# Patient Record
Sex: Female | Born: 1970 | Hispanic: Yes | Marital: Married | Smoking: Never smoker
Health system: Southern US, Community
[De-identification: ages and names within clinical notes are randomized; demographics above are authoritative.]

## PROBLEM LIST (undated history)

## (undated) DIAGNOSIS — E119 Type 2 diabetes mellitus without complications: Secondary | ICD-10-CM

## (undated) HISTORY — PX: CHOLECYSTECTOMY: SHX55

---

## 2015-06-12 ENCOUNTER — Ambulatory Visit: Payer: Self-pay | Attending: Oncology

## 2018-12-06 ENCOUNTER — Other Ambulatory Visit: Payer: Self-pay

## 2018-12-06 ENCOUNTER — Emergency Department: Payer: Self-pay

## 2018-12-06 ENCOUNTER — Emergency Department
Admission: EM | Admit: 2018-12-06 | Discharge: 2018-12-06 | Disposition: A | Discharge status: Home / Self Care | Payer: Self-pay | Attending: Emergency Medicine | Admitting: Emergency Medicine

## 2018-12-06 ENCOUNTER — Encounter: Payer: Self-pay | Admitting: Emergency Medicine

## 2018-12-06 DIAGNOSIS — R1013 Epigastric pain: Secondary | ICD-10-CM | POA: Insufficient documentation

## 2018-12-06 DIAGNOSIS — R1010 Upper abdominal pain, unspecified: Secondary | ICD-10-CM

## 2018-12-06 DIAGNOSIS — E119 Type 2 diabetes mellitus without complications: Secondary | ICD-10-CM | POA: Insufficient documentation

## 2018-12-06 HISTORY — DX: Type 2 diabetes mellitus without complications: E11.9

## 2018-12-06 LAB — CBC
HCT: 40 % (ref 36.0–46.0)
Hemoglobin: 13.5 g/dL (ref 12.0–15.0)
MCH: 30.1 pg (ref 26.0–34.0)
MCHC: 33.8 g/dL (ref 30.0–36.0)
MCV: 89.1 fL (ref 80.0–100.0)
NRBC: 0 % (ref 0.0–0.2)
Platelets: 276 10*3/uL (ref 150–400)
RBC: 4.49 MIL/uL (ref 3.87–5.11)
RDW: 12.6 % (ref 11.5–15.5)
WBC: 6.6 10*3/uL (ref 4.0–10.5)

## 2018-12-06 LAB — COMPREHENSIVE METABOLIC PANEL
ALT: 17 U/L (ref 0–44)
AST: 16 U/L (ref 15–41)
Albumin: 4.1 g/dL (ref 3.5–5.0)
Alkaline Phosphatase: 65 U/L (ref 38–126)
Anion gap: 5 (ref 5–15)
BUN: 11 mg/dL (ref 6–20)
CALCIUM: 9 mg/dL (ref 8.9–10.3)
CO2: 25 mmol/L (ref 22–32)
Chloride: 107 mmol/L (ref 98–111)
Creatinine, Ser: 0.55 mg/dL (ref 0.44–1.00)
GFR calc non Af Amer: >60 mL/min (ref >60)
Glucose, Bld: 91 mg/dL (ref 70–99)
Potassium: 3.5 mmol/L (ref 3.5–5.1)
Sodium: 137 mmol/L (ref 135–145)
Total Bilirubin: 1.1 mg/dL (ref 0.3–1.2)
Total Protein: 8 g/dL (ref 6.5–8.1)

## 2018-12-06 LAB — URINALYSIS, COMPLETE (UACMP) WITH MICROSCOPIC
BILIRUBIN URINE: NEGATIVE
Bacteria, UA: NONE SEEN
Glucose, UA: NEGATIVE mg/dL
Ketones, ur: NEGATIVE mg/dL
Nitrite: NEGATIVE
Protein, ur: NEGATIVE mg/dL
Specific Gravity, Urine: 1.018 (ref 1.005–1.030)
pH: 5 (ref 5.0–8.0)

## 2018-12-06 LAB — LIPASE, BLOOD: Lipase: 31 U/L (ref 11–51)

## 2018-12-06 MED ORDER — ALUM & MAG HYDROXIDE-SIMETH 200-200-20 MG/5ML PO SUSP
30.0000 mL | Freq: Once | ORAL | Status: AC
  Administered 2018-12-06: 30 mL via ORAL
  Filled 2018-12-06: qty 30

## 2018-12-06 MED ORDER — IOHEXOL 300 MG/ML  SOLN
100.0000 mL | Freq: Once | INTRAMUSCULAR | Status: AC | PRN
  Administered 2018-12-06: 100 mL via INTRAVENOUS
  Filled 2018-12-06: qty 100

## 2018-12-06 MED ORDER — IOPAMIDOL (ISOVUE-300) INJECTION 61%
100.0000 mL | Freq: Once | INTRAVENOUS | Status: DC | PRN
  Filled 2018-12-06: qty 100

## 2018-12-06 MED ORDER — LIDOCAINE VISCOUS HCL 2 % MT SOLN
15.0000 mL | Freq: Once | OROMUCOSAL | Status: AC
  Administered 2018-12-06: 15 mL via ORAL
  Filled 2018-12-06: qty 15

## 2018-12-06 MED ORDER — SODIUM CHLORIDE 0.9% FLUSH: 3.0000 mL | Freq: Once | INTRAVENOUS | Status: DC

## 2018-12-06 MED ORDER — IOPAMIDOL (ISOVUE-300) INJECTION 61%
30.0000 mL | Freq: Once | INTRAVENOUS | Status: DC | PRN
  Filled 2018-12-06: qty 30

## 2018-12-06 MED ORDER — TRAMADOL HCL 50 MG PO TABS: 50.0000 mg | ORAL_TABLET | Freq: Four times a day (QID) | ORAL | 0 refills | Status: DC | PRN

## 2018-12-06 NOTE — ED Triage Notes (Signed)
Pt sent from [facility] center with c/o abd pain described as throbing since last night, denies n/v/d. Interpreter used.

## 2018-12-06 NOTE — Discharge Instructions (Addendum)
Please follow up with general surgery. You have inflammation of your gallbladder that has been there for a long time. This needs to be evaluated by a surgeon.

## 2018-12-06 NOTE — ED Notes (Signed)
Interpreter requested for discharge 

## 2018-12-06 NOTE — ED Provider Notes (Signed)
[facility] Emergency Department Provider Note   ____________________________________________    I have reviewed the triage vital signs and the nursing notes.   HISTORY  Chief Complaint Abdominal Pain  Spanish interpreter Kandis Cocking used   HPI Maria Schmitt is a 48 y.o. female who presents with complaints of abdominal pain.  Patient reports 2 days of primarily epigastric abdominal pain which she notes is constant and moderate and burning in intensity.  She denies a history of abdominal surgery.  Yesterday she took 2 Tylenol for this which did help somewhat.  No significant nausea or vomiting.  No fevers or chills.  No recent travel.  Past Medical History:  Diagnosis Date  . Diabetes mellitus without complication (HCC)     There are no active problems to display for this patient.   History reviewed. No pertinent surgical history.  Prior to Admission medications   Medication Sig Start Date End Date Taking? Authorizing Provider  traMADol (ULTRAM) 50 MG tablet Take 1 tablet (50 mg total) by mouth every 6 (six) hours as needed. 12/06/18 12/06/19  Jene Every, MD     Allergies Patient has no known allergies.  No family history on file.  Social History Social History   Tobacco Use  . Smoking status: Never Smoker  . Smokeless tobacco: Never Used  Substance Use Topics  . Alcohol use: Not on file  . Drug use: Not on file    Review of Systems  Constitutional: No fever/chills Eyes: No visual changes.  ENT: No sore throat. Cardiovascular: Denies chest pain. Respiratory: Denies shortness of breath. Gastrointestinal: As above Genitourinary: Negative for dysuria. Musculoskeletal: Negative for back pain. Skin: Negative for rash. Neurological: Negative for headaches    ____________________________________________   PHYSICAL EXAM:  VITAL SIGNS: ED Triage Vitals  Enc Vitals Group     BP 12/06/18 1323 100/77     Pulse Rate 12/06/18  1323 71     Resp 12/06/18 1323 16     Temp 12/06/18 1323 98.1 F (36.7 C)     Temp Source 12/06/18 1323 Oral     SpO2 12/06/18 1323 96 %     Weight --      Height --      Head Circumference --      Peak Flow --      Pain Score 12/06/18 1324 6     Pain Loc --      Pain Edu? --      Excl. in GC? --     Constitutional: Alert and oriented. No acute distress.  Eyes: Conjunctivae are normal.   Nose: No congestion/rhinnorhea. Mouth/Throat: Mucous membranes are moist.    Cardiovascular: Normal rate, regular rhythm. Grossly normal heart sounds.  Good peripheral circulation. Respiratory: Normal respiratory effort.  No retractions. Lungs CTAB. Gastrointestinal: Mild epigastric tenderness, no significant distention, no CVA tenderness  Musculoskeletal: Warm and well perfused Neurologic:  Normal speech and language. No gross focal neurologic deficits are appreciated.  Skin:  Skin is warm, dry and intact. No rash noted.  ____________________________________________   LABS (all labs ordered are listed, but only abnormal results are displayed)  Labs Reviewed  URINALYSIS, COMPLETE (UACMP) WITH MICROSCOPIC - Abnormal; Notable for the following components:      Result Value   Color, Urine YELLOW (*)    APPearance CLEAR (*)    Hgb urine dipstick MODERATE (*)    Leukocytes,Ua SMALL (*)    All other components within normal limits  LIPASE, BLOOD  COMPREHENSIVE METABOLIC PANEL  CBC  POC URINE PREG, ED   ____________________________________________  EKG  None ____________________________________________  RADIOLOGY  None____________________________________________   PROCEDURES  Procedure(s) performed: No  Procedures   Critical Care performed: No ____________________________________________   INITIAL IMPRESSION / ASSESSMENT AND PLAN / ED COURSE  Pertinent labs & imaging results that were available during my care of the patient were reviewed by me and considered in my  medical decision making (see chart for details).  Patient presents with epigastric discomfort primarily, suspicious for gastritis, lab work is overall quite reassuring, lipase LFTs normal.  We will treat with GI cocktail and reevaluate  GI cocktail with mild improvement.  Ultrasound obtained which showed chronic changes however the patient has no significant tenderness over the right upper quadrant, discussed with her at length the need for outpatient follow-up with general surgery for further evaluation especially given polyps noted and the fact that they could be cancerous.  Obtain CT abdomen pelvis which was unremarkable.  Unclear cause of patient's pain, we will try analgesics for 24 hours with instructions to return if patient has not improved or is worsening    ____________________________________________   FINAL CLINICAL IMPRESSION(S) / ED DIAGNOSES  Final diagnoses:  Upper abdominal pain        Note:  This document was prepared using Dragon voice recognition software and may include unintentional dictation errors.   Jene Every, MD 12/06/18 Serena Croissant

## 2018-12-06 NOTE — ED Notes (Signed)
EDP at bedside reviewing discharge with spanish interpreter at bedside.

## 2018-12-06 NOTE — ED Notes (Signed)
Spanish interpreter requested. 

## 2018-12-12 ENCOUNTER — Encounter: Payer: Self-pay | Admitting: *Deleted

## 2019-01-09 ENCOUNTER — Ambulatory Visit (INDEPENDENT_AMBULATORY_CARE_PROVIDER_SITE_OTHER): Payer: Self-pay | Admitting: Surgery

## 2019-01-09 ENCOUNTER — Other Ambulatory Visit: Payer: Self-pay

## 2019-01-09 ENCOUNTER — Encounter: Payer: Self-pay | Admitting: Surgery

## 2019-01-09 VITALS — BP 105/73 | HR 80 | Temp 98.6°F | Ht 63.0 in | Wt 144.8 lb

## 2019-01-09 DIAGNOSIS — R1012 Left upper quadrant pain: Secondary | ICD-10-CM

## 2019-01-09 NOTE — Patient Instructions (Signed)
Return in six months right upper  ultrasound.

## 2019-01-10 ENCOUNTER — Encounter: Payer: Self-pay | Admitting: Surgery

## 2019-01-10 NOTE — Progress Notes (Signed)
Patient ID: Maria Schmitt, female   DOB: 10/24/1971, 48 y.o.   MRN: [id number]  HPI Rachal Kaala Reefer is a 48 y.o. female seen in consultation at the request of Dr. Cyril Loosen.  She recently went to the emergency room for upper abdominal pain.  She reports that she actually had some pain in the left upper quadrant that is sharp in nature and moderate in intensity.  The pain did not radiate.  There was no specific alleviating or aggravating factors.  The pain does not relate with meals. He reports now that her symptoms are very mild had significantly improved.  He denies any fevers any chills any weight loss.  Previous abdominal surgeries.  She is able to perform more than 4 METS of activity without any shortness of breath or chest pain. He did have an ultrasound as well as a scan of the abdomen pelvis that I have personally reviewed.  It is only positive for multiple gallbladder polyps, largest measuring 7 mm. Last CBC and CMP were completely normal  HPI  Past Medical History:  Diagnosis Date  . Diabetes mellitus without complication (HCC)     History reviewed. No pertinent surgical history.  History reviewed. No pertinent family history.  Social History Social History   Tobacco Use  . Smoking status: Never Smoker  . Smokeless tobacco: Never Used  Substance Use Topics  . Alcohol use: Never    Frequency: Never  . Drug use: Never    No Known Allergies  No current outpatient medications on file.   No current facility-administered medications for this visit.      Review of Systems Full ROS  was asked and was negative except for the information on the HPI  Physical Exam Blood pressure 105/73, pulse 80, temperature 98.6 F (37 C), temperature source Temporal, height 5\' 3"  (1.6 m), weight 144 lb 12.8 oz (65.7 kg). CONSTITUTIONAL:NAD EYES: Pupils are equal, round, and reactive to light, Sclera are non-icteric. EARS, NOSE, MOUTH AND THROAT: The oropharynx is clear. The oral  mucosa is pink and moist. Hearing is intact to voice. LYMPH NODES:  Lymph nodes in the neck are normal. RESPIRATORY:  Lungs are clear. There is normal respiratory effort, with equal breath sounds bilaterally, and without pathologic use of accessory muscles. CARDIOVASCULAR: Heart is regular without murmurs, gallops, or rubs. GI: The abdomen is soft, nontender, and nondistended. There are no palpable masses. There is no hepatosplenomegaly. There are normal bowel sounds in all quadrants. GU: Rectal deferred.   MUSCULOSKELETAL: Normal muscle strength and tone. No cyanosis or edema.   SKIN: Turgor is good and there are no pathologic skin lesions or ulcers. NEUROLOGIC: Motor and sensation is grossly normal. Cranial nerves are grossly intact. PSYCH:  Oriented to person, place and time. Affect is normal.  Data Reviewed  I have personally reviewed the patient's imaging, laboratory findings and medical records.    Assessment/Plan 48 yo female w epigastric and left upper quadrant pain unknown etiology at this time.  And she does have gallbladder polyps that could certainly create some symptoms although her symptoms are not classic for biliary colic.  I had an extensive discussion with the patient about the natural history of gallbladder polyps.  We typically recommend cholecystectomy when they reach 1 cm Or have features of concern they are symptomatic.  At this time it is not a clear-cut picture.  After lengthy discussion with the patient options of observation with a follow-up ultrasound in 6 months versus cholecystectomy the patient  wishes to wait.  We will order an ultrasound in 6 months and will see her back at that time we will determine whether a cholecystectomy is indicated or not. This time there is no need for emergent surgical indication. Copy of this report was sent to the referring provider  Sterling Big, MD FACS General Surgeon 01/10/2019, 12:46 PM

## 2019-06-15 ENCOUNTER — Other Ambulatory Visit: Payer: Self-pay

## 2019-06-15 DIAGNOSIS — K824 Cholesterolosis of gallbladder: Secondary | ICD-10-CM

## 2019-07-06 ENCOUNTER — Ambulatory Visit
Admission: RE | Admit: 2019-07-06 | Discharge: 2019-07-06 | Disposition: A | Discharge status: Home / Self Care | Payer: Self-pay | Source: Ambulatory Visit | Attending: Surgery | Admitting: Surgery

## 2019-07-06 ENCOUNTER — Other Ambulatory Visit: Payer: Self-pay

## 2019-07-06 DIAGNOSIS — K824 Cholesterolosis of gallbladder: Secondary | ICD-10-CM

## 2019-07-10 ENCOUNTER — Ambulatory Visit: Payer: Self-pay | Admitting: Surgery

## 2019-07-14 ENCOUNTER — Other Ambulatory Visit: Payer: Self-pay

## 2019-07-14 ENCOUNTER — Ambulatory Visit
Admission: RE | Admit: 2019-07-14 | Discharge: 2019-07-14 | Disposition: A | Discharge status: Home / Self Care | Payer: Self-pay | Source: Ambulatory Visit | Attending: Surgery | Admitting: Surgery

## 2019-07-14 DIAGNOSIS — K824 Cholesterolosis of gallbladder: Secondary | ICD-10-CM | POA: Insufficient documentation

## 2019-07-17 ENCOUNTER — Telehealth: Payer: Self-pay

## 2019-07-17 NOTE — Telephone Encounter (Signed)
Call to patient via Social worker. Unable to leave a message. The patient needs to come back in to see Dr Dahlia Byes for follow up.

## 2019-07-17 NOTE — Telephone Encounter (Signed)
-----   Message from Jules Husbands, MD sent at 07/17/2019  9:55 AM EDT ----- Please let her know she does have small polyps in her gallbladder, please make appt w me ----- Message ----- From: Interface, Rad Results In Sent: 07/14/2019   3:25 PM EDT To: Jules Husbands, MD

## 2019-08-09 ENCOUNTER — Encounter: Payer: Self-pay | Admitting: Surgery

## 2019-08-09 ENCOUNTER — Other Ambulatory Visit: Payer: Self-pay

## 2019-08-09 ENCOUNTER — Ambulatory Visit (INDEPENDENT_AMBULATORY_CARE_PROVIDER_SITE_OTHER): Payer: Self-pay | Admitting: Surgery

## 2019-08-09 VITALS — BP 107/73 | HR 79 | Temp 97.3°F | Resp 12 | Ht 63.0 in | Wt 145.6 lb

## 2019-08-09 DIAGNOSIS — K824 Cholesterolosis of gallbladder: Secondary | ICD-10-CM

## 2019-08-09 NOTE — Patient Instructions (Addendum)
Our surgery scheduler will contact you within 24-48 hours to schedule your surgery. For [facility] December 2020.  Please have the [place] surgery sheet available when speaking with her.

## 2019-08-10 ENCOUNTER — Encounter: Payer: Self-pay | Admitting: Surgery

## 2019-08-10 NOTE — Progress Notes (Addendum)
Outpatient Surgical Follow Up  08/10/2019  Bryan Clairessa Boulet is an 48 y.o. female.   Chief Complaint  Patient presents with  . New Patient (Initial Visit)    gallbladder polyp    HPI: This 48 year old female well-known to me with a history of gallbladder polyp.  She reports that she does have intermittent abdominal pain in the right upper quadrant and some nausea.  I repeated an ultrasound I have personally reviewed showing evidence of multiple polyps the greatest measuring 7 mm.  Normal common bile duct.  No evidence of cholecystitis.  She denies any fevers any chills.  No evidence of biliary obstruction.  She is able to perform more than 4 METS of activity without any shortness of breath or chest pain  Past Medical History:  Diagnosis Date  . Diabetes mellitus without complication ([place])     History reviewed. No pertinent surgical history.  History reviewed. No pertinent family history.  Social History:  reports that she has never smoked. She has never used smokeless tobacco. She reports that she does not drink alcohol or use drugs.  Allergies: No Known Allergies  Medications reviewed.    ROS Full ROS performed and is otherwise negative other than what is stated in HPI   BP 107/73   Pulse 79   Temp (!) 97.3 F (36.3 C) (Temporal)   Resp 12   Ht 5\' 3"  (1.6 m)   Wt 145 lb 9.6 oz (66 kg)   SpO2 97%   BMI 25.79 kg/m   Physical Exam Vitals signs and nursing note reviewed. Exam conducted with a chaperone present.  Constitutional:      General: She is not in acute distress.    Appearance: Normal appearance. She is normal weight.  Eyes:     General: No scleral icterus.       Right eye: No discharge.        Left eye: No discharge.  Neck:     Musculoskeletal: Normal range of motion and neck supple. No neck rigidity or muscular tenderness.  Cardiovascular:     Rate and Rhythm: Normal rate and regular rhythm.     Heart sounds: No murmur.  Pulmonary:     Effort:  Pulmonary effort is normal. No respiratory distress.  Abdominal:     General: Abdomen is flat. There is no distension.     Palpations: There is no mass.     Tenderness: There is no abdominal tenderness. There is no guarding or rebound.     Hernia: No hernia is present.  Musculoskeletal: Normal range of motion.        General: No swelling.  Lymphadenopathy:     Cervical: No cervical adenopathy.  Skin:    General: Skin is warm and dry.     Capillary Refill: Capillary refill takes less than 2 seconds.  Neurological:     General: No focal deficit present.     Mental Status: She is alert and oriented to person, place, and time.  Psychiatric:        Mood and Affect: Mood normal.        Behavior: Behavior normal.        Thought Content: Thought content normal.        Judgment: Judgment normal.     Assessment/Plan: Symptomatic gallbladder polyp measuring 7 mm.  I had an extensive discussion with the patient regarding the gallbladder polyps.  She does seem to have symptoms related to the gallbladder and I do think that she  will be a good candidate for robotic cholecystectomy.  I discussed the procedure in detail.  The patient was given Agricultural engineer.  We discussed the risks and benefits of a laparoscopic cholecystectomy and possible cholangiogram including, but not limited to bleeding, infection, injury to surrounding structures such as the intestine or liver, bile leak, retained gallstones, need to convert to an open procedure, prolonged diarrhea, blood clots such as  DVT, common bile duct injury, anesthesia risks, and possible need for additional procedures.  The likelihood of improvement in symptoms and return to the patient's normal status is good. We discussed the typical post-operative recovery course. Greater than 50% of the 40 minutes  visit was spent in counseling/coordination of care   Sterling Big, MD [facility] General Surgeon

## 2019-10-04 ENCOUNTER — Other Ambulatory Visit: Admission: RE | Admit: 2019-10-04 | Payer: Self-pay | Source: Ambulatory Visit

## 2019-10-06 ENCOUNTER — Other Ambulatory Visit: Admission: RE | Admit: 2019-10-06 | Payer: Self-pay | Source: Ambulatory Visit

## 2019-10-12 ENCOUNTER — Telehealth: Payer: Self-pay | Admitting: Surgery

## 2019-10-12 NOTE — Telephone Encounter (Signed)
Pt has been advised by Almyra Free, CMA of their pre admission date/time, Covid Testing date and Surgery date.  Surgery Date: 11/16/19-Dr Pabon-Robot assisted laparoscopic cholecystectomy.  Preadmission Testing Date: 11/06/19 between 8-1:00pm-phone interview.  Covid Testing Date: 11/14/19 between 8-10:30am - patient advised to go to the [facility] ([place])  [employer] Video sent via [employer] and [employer].  Patient has been made aware to call [phone], between 1-3:00pm the day before surgery, to find out what time to arrive.

## 2019-11-02 ENCOUNTER — Other Ambulatory Visit: Payer: Self-pay

## 2019-11-06 ENCOUNTER — Encounter
Admission: RE | Admit: 2019-11-06 | Discharge: 2019-11-06 | Disposition: A | Discharge status: Home / Self Care | Payer: Self-pay | Source: Ambulatory Visit | Attending: Surgery | Admitting: Surgery

## 2019-11-06 ENCOUNTER — Other Ambulatory Visit: Payer: Self-pay

## 2019-11-06 NOTE — Patient Instructions (Signed)
Your procedure is scheduled on: 11/16/19 Report to [facility]. To find out your arrival time please call [phone] between 1PM - 3PM on 11/15/19.  Remember: Instructions that are not followed completely may result in serious medical risk, up to and including death, or upon the discretion of your surgeon and anesthesiologist your surgery may need to be rescheduled.     _X__ 1. Do not eat food after midnight the night before your procedure.                 No gum chewing or hard candies. You may drink clear liquids up to 2 hours                 before you are scheduled to arrive for your surgery- DO not drink clear                 liquids within 2 hours of the start of your surgery.                 Clear Liquids include:  water, apple juice without pulp, clear carbohydrate                 drink such as Clearfast or Gatorade, Black Coffee or Tea (Do not add                 anything to coffee or tea). Diabetics water only  __X__2.  On the morning of surgery brush your teeth with toothpaste and water, you                 may rinse your mouth with mouthwash if you wish.  Do not swallow any              toothpaste of mouthwash.     _X__ 3.  No Alcohol for 24 hours before or after surgery.   _X__ 4.  Do Not Smoke or use e-cigarettes For 24 Hours Prior to Your Surgery.                 Do not use any chewable tobacco products for at least 6 hours prior to                 surgery.  ____  5.  Bring all medications with you on the day of surgery if instructed.   __X__  6.  Notify your doctor if there is any change in your medical condition      (cold, fever, infections).     Do not wear jewelry, make-up, hairpins, clips or nail polish. Do not wear lotions, powders, or perfumes.  Do not shave 48 hours prior to surgery. Men may shave face and neck. Do not bring valuables to the hospital.    [facility] is not responsible for any belongings or  valuables.  Contacts, dentures/partials or body piercings may not be worn into surgery. Bring a case for your contacts, glasses or hearing aids, a denture cup will be supplied. Leave your suitcase in the car. After surgery it may be brought to your room. For patients admitted to the hospital, discharge time is determined by your treatment team.   Patients discharged the day of surgery will not be allowed to drive home.   Please read over the following fact sheets that you were given:   MRSA Information  __X__ Take these medicines the morning of surgery with A SIP OF WATER:  1. none  2.   3.   4.  5.  6.  ____ Fleet Enema (as directed)   ____ Use CHG Soap/SAGE wipes as directed  ____ Use inhalers on the day of surgery  ____ Stop metformin/Janumet/Farxiga 2 days prior to surgery    ____ Take 1/2 of usual insulin dose the night before surgery. No insulin the morning          of surgery.   ____ Stop Blood Thinners Coumadin/Plavix/Xarelto/Pleta/Pradaxa/Eliquis/Effient/Aspirin  on   Or contact your Surgeon, Cardiologist or Medical Doctor regarding  ability to stop your blood thinners  __X__ Stop Anti-inflammatories 7 days before surgery such as Advil, Ibuprofen, Motrin,  BC or Goodies Powder, Naprosyn, Naproxen, Aleve, Aspirin    __X__ Stop all herbal supplements, fish oil or vitamin E until after surgery.    ____ Bring C-Pap to the hospital.      Instructions via interpreter. Patient verbalized understanding.

## 2019-11-14 ENCOUNTER — Other Ambulatory Visit: Admission: RE | Admit: 2019-11-14 | Payer: Self-pay | Source: Ambulatory Visit

## 2019-11-14 ENCOUNTER — Telehealth: Payer: Self-pay | Admitting: Emergency Medicine

## 2019-11-14 NOTE — Telephone Encounter (Signed)
Per Kennyth Arnold (surgery scheduler) patient did not get her COVID test done. She asked for me to call patient due to language barrier.   I called patient and advised her that she did not get her COVID test done and she stated she had a family emergency. I advised patient that she needs to go tomorrow morning to get a Covid test done in order to keep her Surgery date on 11/16/19. I also advised patient that if she does not get Covid test done we will have to reschedule her surgery and with Covid restrictions we don't know when that will be.  Patient verbalized understanding and states [facility]id test done.

## 2019-11-15 ENCOUNTER — Other Ambulatory Visit: Payer: Self-pay

## 2019-11-15 ENCOUNTER — Other Ambulatory Visit
Admission: RE | Admit: 2019-11-15 | Discharge: 2019-11-15 | Disposition: A | Discharge status: Home / Self Care | Payer: HRSA Program | Source: Ambulatory Visit | Attending: Surgery | Admitting: Surgery

## 2019-11-15 DIAGNOSIS — Z01812 Encounter for preprocedural laboratory examination: Secondary | ICD-10-CM | POA: Diagnosis present

## 2019-11-15 DIAGNOSIS — Z20822 Contact with and (suspected) exposure to covid-19: Secondary | ICD-10-CM | POA: Diagnosis not present

## 2019-11-15 LAB — SARS CORONAVIRUS 2 (TAT 6-24 HRS): SARS Coronavirus 2: NEGATIVE

## 2019-11-15 MED ORDER — INDOCYANINE GREEN 25 MG IV SOLR: 5.0000 mg | Freq: Once | INTRAVENOUS | Status: AC

## 2019-11-15 MED ORDER — INDOCYANINE GREEN 25 MG IV SOLR
7.5000 mg | Freq: Once | INTRAVENOUS | Status: AC
  Administered 2019-11-16: 07:00:00 7.5 mg via INTRAVENOUS
  Filled 2019-11-15: qty 25
  Filled 2019-11-15: qty 7.5

## 2019-11-15 MED ORDER — CEFAZOLIN SODIUM-DEXTROSE 2-4 GM/100ML-% IV SOLN
2.0000 g | INTRAVENOUS | Status: AC
  Administered 2019-11-16: 2 g via INTRAVENOUS

## 2019-11-15 NOTE — Addendum Note (Signed)
Addended by: Sterling Big F on: 11/15/2019 03:03 PM   Modules accepted: Orders

## 2019-11-16 ENCOUNTER — Ambulatory Visit
Admission: RE | Admit: 2019-11-16 | Discharge: 2019-11-16 | Disposition: A | Discharge status: Home / Self Care | Payer: Self-pay | Attending: Surgery | Admitting: Surgery

## 2019-11-16 ENCOUNTER — Encounter: Payer: Self-pay | Admitting: Surgery

## 2019-11-16 ENCOUNTER — Encounter
Admission: RE | Disposition: A | Discharge status: Home / Self Care | Payer: Self-pay | Source: Home / Self Care | Attending: Surgery

## 2019-11-16 ENCOUNTER — Other Ambulatory Visit: Payer: Self-pay

## 2019-11-16 ENCOUNTER — Telehealth: Payer: Self-pay | Admitting: Emergency Medicine

## 2019-11-16 ENCOUNTER — Ambulatory Visit: Payer: Self-pay | Admitting: Anesthesiology

## 2019-11-16 DIAGNOSIS — K811 Chronic cholecystitis: Secondary | ICD-10-CM | POA: Insufficient documentation

## 2019-11-16 DIAGNOSIS — K824 Cholesterolosis of gallbladder: Secondary | ICD-10-CM

## 2019-11-16 DIAGNOSIS — E119 Type 2 diabetes mellitus without complications: Secondary | ICD-10-CM | POA: Insufficient documentation

## 2019-11-16 LAB — GLUCOSE, CAPILLARY
Glucose-Capillary: 101 mg/dL — ABNORMAL HIGH (ref 70–99)
Glucose-Capillary: 144 mg/dL — ABNORMAL HIGH (ref 70–99)

## 2019-11-16 LAB — POCT PREGNANCY, URINE: Preg Test, Ur: NEGATIVE

## 2019-11-16 SURGERY — CHOLECYSTECTOMY, ROBOT-ASSISTED, LAPAROSCOPIC
Anesthesia: General

## 2019-11-16 MED ORDER — SUGAMMADEX SODIUM 500 MG/5ML IV SOLN
INTRAVENOUS | Status: AC
  Filled 2019-11-16: qty 5

## 2019-11-16 MED ORDER — HYDROCODONE-ACETAMINOPHEN 5-325 MG PO TABS
ORAL_TABLET | ORAL | Status: AC
  Administered 2019-11-16: 12:00:00 1 via ORAL
  Filled 2019-11-16: qty 1

## 2019-11-16 MED ORDER — ONDANSETRON HCL 4 MG/2ML IJ SOLN: 4.0000 mg | Freq: Once | INTRAMUSCULAR | Status: DC | PRN

## 2019-11-16 MED ORDER — ESMOLOL HCL 100 MG/10ML IV SOLN
INTRAVENOUS | Status: DC | PRN
  Administered 2019-11-16: 30 mg via INTRAVENOUS

## 2019-11-16 MED ORDER — MIDAZOLAM HCL 2 MG/2ML IJ SOLN
INTRAMUSCULAR | Status: DC | PRN
  Administered 2019-11-16: 2 mg via INTRAVENOUS

## 2019-11-16 MED ORDER — SODIUM CHLORIDE 0.9 % IV SOLN: INTRAVENOUS | Status: DC

## 2019-11-16 MED ORDER — DEXAMETHASONE SODIUM PHOSPHATE 10 MG/ML IJ SOLN
INTRAMUSCULAR | Status: AC
  Filled 2019-11-16: qty 1

## 2019-11-16 MED ORDER — LIDOCAINE HCL (CARDIAC) PF 100 MG/5ML IV SOSY
PREFILLED_SYRINGE | INTRAVENOUS | Status: DC | PRN
  Administered 2019-11-16: 100 mg via INTRAVENOUS

## 2019-11-16 MED ORDER — GLYCOPYRROLATE 0.2 MG/ML IJ SOLN
INTRAMUSCULAR | Status: DC | PRN
  Administered 2019-11-16: .2 mg via INTRAVENOUS

## 2019-11-16 MED ORDER — SUGAMMADEX SODIUM 500 MG/5ML IV SOLN
INTRAVENOUS | Status: DC | PRN
  Administered 2019-11-16: 300 mg via INTRAVENOUS

## 2019-11-16 MED ORDER — CHLORHEXIDINE GLUCONATE CLOTH 2 % EX PADS: 6.0000 | MEDICATED_PAD | Freq: Once | CUTANEOUS | Status: DC

## 2019-11-16 MED ORDER — FENTANYL CITRATE (PF) 100 MCG/2ML IJ SOLN
INTRAMUSCULAR | Status: DC | PRN
  Administered 2019-11-16: 50 ug via INTRAVENOUS

## 2019-11-16 MED ORDER — SUCCINYLCHOLINE CHLORIDE 20 MG/ML IJ SOLN
INTRAMUSCULAR | Status: AC
  Filled 2019-11-16: qty 1

## 2019-11-16 MED ORDER — FENTANYL CITRATE (PF) 100 MCG/2ML IJ SOLN: 25.0000 ug | INTRAMUSCULAR | Status: DC | PRN

## 2019-11-16 MED ORDER — PHENYLEPHRINE HCL (PRESSORS) 10 MG/ML IV SOLN
INTRAVENOUS | Status: AC
  Filled 2019-11-16: qty 1

## 2019-11-16 MED ORDER — BUPIVACAINE-EPINEPHRINE (PF) 0.25% -1:200000 IJ SOLN
INTRAMUSCULAR | Status: DC | PRN
  Administered 2019-11-16: 30 mL

## 2019-11-16 MED ORDER — ROCURONIUM BROMIDE 50 MG/5ML IV SOLN
INTRAVENOUS | Status: AC
  Filled 2019-11-16: qty 1

## 2019-11-16 MED ORDER — GLYCOPYRROLATE 0.2 MG/ML IJ SOLN
INTRAMUSCULAR | Status: AC
  Filled 2019-11-16: qty 1

## 2019-11-16 MED ORDER — EPHEDRINE SULFATE 50 MG/ML IJ SOLN
INTRAMUSCULAR | Status: AC
  Filled 2019-11-16: qty 1

## 2019-11-16 MED ORDER — MIDAZOLAM HCL 2 MG/2ML IJ SOLN
INTRAMUSCULAR | Status: AC
  Filled 2019-11-16: qty 2

## 2019-11-16 MED ORDER — METOCLOPRAMIDE HCL 5 MG/ML IJ SOLN: 10.0000 mg | Freq: Once | INTRAMUSCULAR | Status: AC

## 2019-11-16 MED ORDER — DEXAMETHASONE SODIUM PHOSPHATE 10 MG/ML IJ SOLN
INTRAMUSCULAR | Status: DC | PRN
  Administered 2019-11-16: 10 mg via INTRAVENOUS

## 2019-11-16 MED ORDER — ONDANSETRON HCL 4 MG/2ML IJ SOLN
INTRAMUSCULAR | Status: AC
  Filled 2019-11-16: qty 2

## 2019-11-16 MED ORDER — FENTANYL CITRATE (PF) 100 MCG/2ML IJ SOLN
INTRAMUSCULAR | Status: AC
  Filled 2019-11-16: qty 2

## 2019-11-16 MED ORDER — FAMOTIDINE 20 MG PO TABS: 20.0000 mg | ORAL_TABLET | Freq: Once | ORAL | Status: AC

## 2019-11-16 MED ORDER — EPINEPHRINE PF 1 MG/ML IJ SOLN
INTRAMUSCULAR | Status: AC
  Filled 2019-11-16: qty 1

## 2019-11-16 MED ORDER — FAMOTIDINE 20 MG PO TABS
ORAL_TABLET | ORAL | Status: AC
  Administered 2019-11-16: 20 mg via ORAL
  Filled 2019-11-16: qty 1

## 2019-11-16 MED ORDER — METOCLOPRAMIDE HCL 5 MG/ML IJ SOLN
INTRAMUSCULAR | Status: AC
  Administered 2019-11-16: 10:00:00 10 mg via INTRAVENOUS
  Filled 2019-11-16: qty 2

## 2019-11-16 MED ORDER — KETOROLAC TROMETHAMINE 30 MG/ML IJ SOLN
INTRAMUSCULAR | Status: DC | PRN
  Administered 2019-11-16: 30 mg via INTRAVENOUS

## 2019-11-16 MED ORDER — HYDROCODONE-ACETAMINOPHEN 5-325 MG PO TABS: 1.0000 | ORAL_TABLET | Freq: Four times a day (QID) | ORAL | 0 refills | Status: DC | PRN

## 2019-11-16 MED ORDER — HYDROCODONE-ACETAMINOPHEN 5-325 MG PO TABS: 1.0000 | ORAL_TABLET | Freq: Four times a day (QID) | ORAL | Status: DC | PRN

## 2019-11-16 MED ORDER — CEFAZOLIN SODIUM-DEXTROSE 2-4 GM/100ML-% IV SOLN
INTRAVENOUS | Status: AC
  Filled 2019-11-16: qty 100

## 2019-11-16 MED ORDER — ROCURONIUM BROMIDE 100 MG/10ML IV SOLN
INTRAVENOUS | Status: DC | PRN
  Administered 2019-11-16: 20 mg via INTRAVENOUS
  Administered 2019-11-16: 40 mg via INTRAVENOUS

## 2019-11-16 MED ORDER — ONDANSETRON HCL 4 MG/2ML IJ SOLN
INTRAMUSCULAR | Status: DC | PRN
  Administered 2019-11-16: 4 mg via INTRAVENOUS

## 2019-11-16 MED ORDER — PROPOFOL 10 MG/ML IV BOLUS
INTRAVENOUS | Status: AC
  Filled 2019-11-16: qty 20

## 2019-11-16 MED ORDER — PROPOFOL 10 MG/ML IV BOLUS
INTRAVENOUS | Status: DC | PRN
  Administered 2019-11-16: 150 mg via INTRAVENOUS

## 2019-11-16 MED ORDER — LIDOCAINE HCL (PF) 2 % IJ SOLN
INTRAMUSCULAR | Status: AC
  Filled 2019-11-16: qty 10

## 2019-11-16 MED ORDER — BUPIVACAINE HCL (PF) 0.25 % IJ SOLN
INTRAMUSCULAR | Status: AC
  Filled 2019-11-16: qty 30

## 2019-11-16 MED ORDER — KETOROLAC TROMETHAMINE 30 MG/ML IJ SOLN
INTRAMUSCULAR | Status: AC
  Filled 2019-11-16: qty 1

## 2019-11-16 MED ORDER — PHENYLEPHRINE HCL (PRESSORS) 10 MG/ML IV SOLN
INTRAVENOUS | Status: DC | PRN
  Administered 2019-11-16 (×3): 100 ug via INTRAVENOUS

## 2019-11-16 MED ORDER — KETAMINE HCL 50 MG/ML IJ SOLN
INTRAMUSCULAR | Status: AC
  Filled 2019-11-16: qty 10

## 2019-11-16 SURGICAL SUPPLY — 46 items
CANISTER SUCT 1200ML W/VALVE (MISCELLANEOUS) ×3 IMPLANT
CHLORAPREP W/TINT 26 (MISCELLANEOUS) ×3 IMPLANT
CLIP VESOLOCK MED LG 6/CT (CLIP) ×3 IMPLANT
COVER WAND RF STERILE (DRAPES) ×3 IMPLANT
DECANTER SPIKE VIAL GLASS SM (MISCELLANEOUS) ×3 IMPLANT
DEFOGGER SCOPE WARMER CLEARIFY (MISCELLANEOUS) ×3 IMPLANT
DERMABOND ADVANCED (GAUZE/BANDAGES/DRESSINGS) ×2
DERMABOND ADVANCED .7 DNX12 (GAUZE/BANDAGES/DRESSINGS) ×1 IMPLANT
DRAPE 3/4 80X56 (DRAPES) IMPLANT
DRAPE ARM DVNC X/XI (DISPOSABLE) ×4 IMPLANT
DRAPE COLUMN DVNC XI (DISPOSABLE) ×1 IMPLANT
DRAPE DA VINCI XI ARM (DISPOSABLE) ×8
DRAPE DA VINCI XI COLUMN (DISPOSABLE) ×2
ELECT CAUTERY BLADE 6.4 (BLADE) ×3 IMPLANT
ELECT REM PT RETURN 9FT ADLT (ELECTROSURGICAL) ×3
ELECTRODE REM PT RTRN 9FT ADLT (ELECTROSURGICAL) ×1 IMPLANT
GLOVE BIO SURGEON STRL SZ7 (GLOVE) ×6 IMPLANT
GOWN STRL REUS W/ TWL LRG LVL3 (GOWN DISPOSABLE) ×4 IMPLANT
GOWN STRL REUS W/TWL LRG LVL3 (GOWN DISPOSABLE) ×8
IRRIGATION STRYKERFLOW (MISCELLANEOUS) IMPLANT
IRRIGATOR STRYKERFLOW (MISCELLANEOUS)
IV NS 1000ML (IV SOLUTION)
IV NS 1000ML BAXH (IV SOLUTION) IMPLANT
KIT PINK PAD W/HEAD ARE REST (MISCELLANEOUS) ×3
KIT PINK PAD W/HEAD ARM REST (MISCELLANEOUS) ×1 IMPLANT
LABEL OR SOLS (LABEL) ×3 IMPLANT
NEEDLE HYPO 22GX1.5 SAFETY (NEEDLE) ×3 IMPLANT
NS IRRIG 500ML POUR BTL (IV SOLUTION) ×3 IMPLANT
OBTURATOR OPTICAL STANDARD 8MM (TROCAR) ×2
OBTURATOR OPTICAL STND 8 DVNC (TROCAR) ×1
OBTURATOR OPTICALSTD 8 DVNC (TROCAR) ×1 IMPLANT
PACK LAP CHOLECYSTECTOMY (MISCELLANEOUS) ×3 IMPLANT
PENCIL ELECTRO HAND CTR (MISCELLANEOUS) ×3 IMPLANT
POUCH SPECIMEN RETRIEVAL 10MM (ENDOMECHANICALS) ×3 IMPLANT
SEAL CANN UNIV 5-8 DVNC XI (MISCELLANEOUS) ×4 IMPLANT
SEAL XI 5MM-8MM UNIVERSAL (MISCELLANEOUS) ×8
SOLUTION ELECTROLUBE (MISCELLANEOUS) ×3 IMPLANT
SPONGE LAP 18X18 RF (DISPOSABLE) ×3 IMPLANT
SPONGE LAP 4X18 RFD (DISPOSABLE) ×3 IMPLANT
SUT MNCRL 4-0 (SUTURE) ×2
SUT MNCRL 4-0 27XMFL (SUTURE) ×1
SUT MNCRL AB 4-0 PS2 18 (SUTURE) ×3 IMPLANT
SUT VICRYL 0 AB UR-6 (SUTURE) ×6 IMPLANT
SUTURE MNCRL 4-0 27XMF (SUTURE) ×1 IMPLANT
TROCAR 130MM GELPORT  DAV (MISCELLANEOUS) ×3 IMPLANT
TUBING EVAC SMOKE HEATED PNEUM (TUBING) ×3 IMPLANT

## 2019-11-16 NOTE — Anesthesia Procedure Notes (Signed)
Procedure Name: Intubation Performed by: Fletcher-Harrison, Jasir Rother, CRNA Pre-anesthesia Checklist: Patient identified, Emergency Drugs available, Suction available and Patient being monitored Patient Re-evaluated:Patient Re-evaluated prior to induction Oxygen Delivery Method: Circle system utilized Preoxygenation: Pre-oxygenation with 100% oxygen Induction Type: IV induction Ventilation: Mask ventilation without difficulty Laryngoscope Size: McGraph and 3 Grade View: Grade I Tube type: Oral Tube size: 6.5 mm Number of attempts: 1 Airway Equipment and Method: Stylet Placement Confirmation: ETT inserted through vocal cords under direct vision,  positive ETCO2,  CO2 detector and breath sounds checked- equal and bilateral Secured at: 21 cm Tube secured with: Tape Dental Injury: Teeth and Oropharynx as per pre-operative assessment        

## 2019-11-16 NOTE — Discharge Instructions (Addendum)

## 2019-11-16 NOTE — Anesthesia Preprocedure Evaluation (Signed)
Anesthesia Evaluation  Patient identified by MRN, date of birth, ID band Patient awake    Reviewed: Allergy & Precautions, NPO status , Patient's Chart, lab work & pertinent test results  History of Anesthesia Complications Negative for: history of anesthetic complications  Airway Mallampati: II       Dental   Pulmonary neg sleep apnea, neg COPD, Not current smoker,           Cardiovascular (-) hypertension(-) Past MI and (-) CHF (-) dysrhythmias (-) Valvular Problems/Murmurs     Neuro/Psych neg Seizures    GI/Hepatic Neg liver ROS, neg GERD  ,  Endo/Other  diabetes ([place]), Type 2  Renal/GU negative Renal ROS     Musculoskeletal   Abdominal   Peds  Hematology   Anesthesia Other Findings   Reproductive/Obstetrics                             Anesthesia Physical Anesthesia Plan  ASA: II  Anesthesia Plan: General   Post-op Pain Management:    Induction: Intravenous  PONV Risk Score and Plan: 3 and Ondansetron, Dexamethasone and Midazolam  Airway Management Planned: Oral ETT  Additional Equipment:   Intra-op Plan:   Post-operative Plan:   Informed Consent: I have reviewed the patients History and Physical, chart, labs and discussed the procedure including the risks, benefits and alternatives for the proposed anesthesia with the patient or authorized representative who has indicated his/her understanding and acceptance.       Plan Discussed with:   Anesthesia Plan Comments:         Anesthesia Quick Evaluation

## 2019-11-16 NOTE — H&P (Signed)
Chief Complaint  Patient presents with  . New Patient (Initial Visit)    gallbladder polyp    HPI: This 49 year old female well-known to me with a history of gallbladder polyp.  She reports that she does have intermittent abdominal pain in the right upper quadrant and some nausea.  I repeated an ultrasound I have personally reviewed showing evidence of multiple polyps the greatest measuring 7 mm.  Normal common bile duct.  No evidence of cholecystitis.  She denies any fevers any chills.  No evidence of biliary obstruction.  She is able to perform more than 4 METS of activity without any shortness of breath or chest pain      Past Medical History:  Diagnosis Date  . Diabetes mellitus without complication (HCC)     History reviewed. No pertinent surgical history.  History reviewed. No pertinent family history.  Social History:  reports that she has never smoked. She has never used smokeless tobacco. She reports that she does not drink alcohol or use drugs.  Allergies: No Known Allergies  Medications reviewed.    ROS Full ROS performed and is otherwise negative other than what is stated in HPI  Physical Exam Vitals signs and nursing note reviewed. Exam conducted with a chaperone present.  Constitutional:      General: She is not in acute distress.    Appearance: Normal appearance. She is normal weight.  Eyes:     General: No scleral icterus.       Right eye: No discharge.        Left eye: No discharge.  Neck:     Musculoskeletal: Normal range of motion and neck supple. No neck rigidity or muscular tenderness.  Cardiovascular:     Rate and Rhythm: Normal rate and regular rhythm.     Heart sounds: No murmur.  Pulmonary:     Effort: Pulmonary effort is normal. No respiratory distress.  Abdominal:     General: Abdomen is flat. There is no distension.     Palpations: There is no mass.     Tenderness: There is no abdominal tenderness. There is no guarding or  rebound.     Hernia: No hernia is present.  Musculoskeletal: Normal range of motion.        General: No swelling.  Lymphadenopathy:     Cervical: No cervical adenopathy.  Skin:    General: Skin is warm and dry.     Capillary Refill: Capillary refill takes less than 2 seconds.  Neurological:     General: No focal deficit present.     Mental Status: She is alert and oriented to person, place, and time.  Psychiatric:        Mood and Affect: Mood normal.        Behavior: Behavior normal.        Thought Content: Thought content normal.        Judgment: Judgment normal.     Assessment/Plan: Symptomatic gallbladder polyp measuring 7 mm.  I had an extensive discussion with the patient regarding the gallbladder polyps.  She does seem to have symptoms related to the gallbladder and I do think that she will be a good candidate for robotic cholecystectomy.  I discussed the procedure in detail.  The patient was given Agricultural engineer.  We discussed the risks and benefits of a laparoscopic cholecystectomy and possible cholangiogram including, but not limited to bleeding, infection, injury to surrounding structures such as the intestine or liver, bile leak, retained gallstones, need to convert  to an open procedure, prolonged diarrhea, blood clots such as  DVT, common bile duct injury, anesthesia risks, and possible need for additional procedures.  The likelihood of improvement in symptoms and return to the patient's normal status is good. We discussed the typical post-operative recovery course.

## 2019-11-16 NOTE — Telephone Encounter (Signed)
-----   Message from Leafy Ro, MD sent at 11/16/2019  7:25 AM EST ----- Please let pt know they r covid negative ----- Message ----- From: Interface, Lab In [facility] Sent: 11/15/2019   4:45 PM EST To: Leafy Ro, MD

## 2019-11-16 NOTE — Telephone Encounter (Signed)
Called patient and spouse answered. Made him aware of results, he verbalized to me that he will let wife know of results. States she can proceed with Surgery today.

## 2019-11-16 NOTE — Transfer of Care (Signed)
Immediate Anesthesia Transfer of Care Note  Patient: Maria Schmitt  Procedure(s) Performed: XI ROBOTIC ASSISTED LAPAROSCOPIC CHOLECYSTECTOMY (N/A )  Patient Location: PACU  Anesthesia Type:General  Level of Consciousness: awake, alert  and patient cooperative  Airway & Oxygen Therapy: Patient Spontanous Breathing and Patient connected to face mask oxygen  Post-op Assessment: Report given to RN and Post -op Vital signs reviewed and stable  Post vital signs: Reviewed and stable  Last Vitals:  Vitals Value Taken Time  BP 126/86 11/16/19 0924  Temp    Pulse 86 11/16/19 0925  Resp 23 11/16/19 0925  SpO2 98 % 11/16/19 0925  Vitals shown include unvalidated device data.  Last Pain:  Vitals:   11/16/19 0924  TempSrc:   PainSc: (P) 0-No pain         Complications: No apparent anesthesia complications

## 2019-11-16 NOTE — OR Nursing (Signed)
Discharge instructions given to husband and spouse via interpreter.  Verbalize understanding.

## 2019-11-16 NOTE — Op Note (Signed)
Robotic assisted laparoscopic Cholecystectomy  Pre-operative Diagnosis: Gallbladder polyp  Post-operative Diagnosis: same  Procedure:  Robotic assisted laparoscopic Cholecystectomy  Surgeon: Sterling Big, MD FACS  Anesthesia: Gen. with endotracheal tube  Findings: Chronic mild Cholecystitis   Estimated Blood Loss: 10cc       Specimens: Gallbladder           Complications: none   Procedure Details  The patient was seen again in the Holding Room. The benefits, complications, treatment options, and expected outcomes were discussed with the patient. The risks of bleeding, infection, recurrence of symptoms, failure to resolve symptoms, bile duct damage, bile duct leak, retained common bile duct stone, bowel injury, any of which could require further surgery and/or ERCP, stent, or papillotomy were reviewed with the patient. The likelihood of improving the patient's symptoms with return to their baseline status is good.  The patient and/or family concurred with the proposed plan, giving informed consent.  The patient was taken to Operating Room, identified  and the procedure verified as Laparoscopic Cholecystectomy.  A Time Out was held and the above information confirmed.  Prior to the induction of general anesthesia, antibiotic prophylaxis was administered. VTE prophylaxis was in place. General endotracheal anesthesia was then administered and tolerated well. After the induction, the abdomen was prepped with Chloraprep and draped in the sterile fashion. The patient was positioned in the supine position.  Cut down technique was used to enter the abdominal cavity and a Hasson trochar was placed after two vicryl stitches were anchored to the fascia. Pneumoperitoneum was then created with CO2 and tolerated well without any adverse changes in the patient's vital signs.  Three 8-mm ports were placed under direct vision. All skin incisions  were infiltrated with a local anesthetic agent before making  the incision and placing the trocars.   The patient was positioned  in reverse Trendelenburg, robot was brought to the surgical field and docked in the standard fashion.  We made sure all the instrumentation was kept indirect view at all times and that there were no collision between the arms. I scrubbed out and went to the console.  The gallbladder was identified, the fundus grasped and retracted cephalad. Adhesions were lysed bluntly. The infundibulum was grasped and retracted laterally, exposing the peritoneum overlying the triangle of Calot. This was then divided and exposed in a blunt fashion. An extended critical view of the cystic duct and cystic artery was obtained.  The cystic duct was clearly identified and bluntly dissected.   Artery and duct were double clipped and divided. Using ICG cholangiography we visualize the cystic duct and so no a Baron biliary ductal anatomy or evidence of bile injuries. The gallbladder was taken from the gallbladder fossa in a retrograde fashion with the electrocautery.  Hemostasis was achieved with the electrocautery. nspection of the right upper quadrant was performed. No bleeding, bile duct injury or leak, or bowel injury was noted. Robotic instruments and robotic arms were undocked in the standard fashion.  I scrubbed back in.  The gallbladder was removed and placed in an Endocatch bag.   Pneumoperitoneum was released.  The periumbilical port site was closed with interrumpted 0 Vicryl sutures. 4-0 subcuticular Monocryl was used to close the skin. Dermabond was  applied.  The patient was then extubated and brought to the recovery room in stable condition. Sponge, lap, and needle counts were correct at closure and at the conclusion of the case.  Caroleen Hamman, MD, FACS

## 2019-11-16 NOTE — Anesthesia Postprocedure Evaluation (Signed)
Anesthesia Post Note  Patient: Maria Schmitt  Procedure(s) Performed: XI ROBOTIC ASSISTED LAPAROSCOPIC CHOLECYSTECTOMY (N/A )  Patient location during evaluation: PACU Anesthesia Type: General Level of consciousness: awake and alert Pain management: pain level controlled Vital Signs Assessment: post-procedure vital signs reviewed and stable Respiratory status: spontaneous breathing and respiratory function stable Cardiovascular status: stable Anesthetic complications: no     Last Vitals:  Vitals:   11/16/19 0646 11/16/19 0924  BP:  126/86  Pulse:  86  Resp:  (!) 22  Temp: 36.7 C (!) 35.8 C  SpO2:  97%    Last Pain:  Vitals:   11/16/19 0924  TempSrc:   PainSc: 0-No pain                 Odalys Win K

## 2019-11-17 LAB — SURGICAL PATHOLOGY

## 2019-11-29 ENCOUNTER — Other Ambulatory Visit: Payer: Self-pay

## 2019-11-29 ENCOUNTER — Encounter: Payer: Self-pay | Admitting: Surgery

## 2019-11-29 ENCOUNTER — Ambulatory Visit (INDEPENDENT_AMBULATORY_CARE_PROVIDER_SITE_OTHER): Payer: Self-pay | Admitting: Surgery

## 2019-11-29 VITALS — BP 120/79 | HR 94 | Temp 97.5°F | Resp 12 | Ht 63.0 in | Wt 144.4 lb

## 2019-11-29 DIAGNOSIS — Z09 Encounter for follow-up examination after completed treatment for conditions other than malignant neoplasm: Secondary | ICD-10-CM

## 2019-11-29 NOTE — Progress Notes (Signed)
S/p rob. GB Doing well Had some nausea first two days post op Doing well now, no fevers or chills  Taking PO Path d/w pt   PE NAD Abd: soft, nt incisions c./d/i no infection  A/p Doing well w/o complications No heavy lifting RTC prn

## 2019-11-29 NOTE — Patient Instructions (Addendum)

## 2019-12-15 IMAGING — CT CT ABD-PELV W/ CM
2 of 5 series · 16 of 46 positions shown, 18 images · IV contrast (APPLIED)
Comparison: None.

CLINICAL DATA: Generalized abdomen pain since last night.

EXAM:
CT ABDOMEN AND PELVIS WITH CONTRAST
TECHNIQUE: Multidetector CT imaging of the abdomen and pelvis was performed
using the standard protocol following bolus administration of
intravenous contrast.
CONTRAST:  100mL OMNIPAQUE IOHEXOL 300 MG/ML  SOLN

[Series 2: routine abd/pel with · axial · 0.76mm/px · z∈[-1008,-593]mm · 13 of 95 slices shown, 15 images]
[im 6/95  soft-tissue]
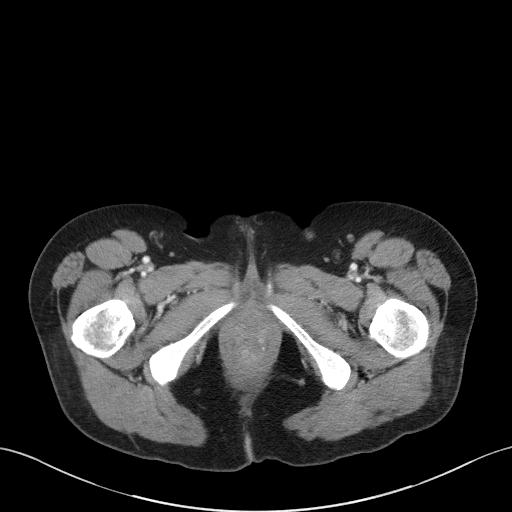
[im 6/95  bone]
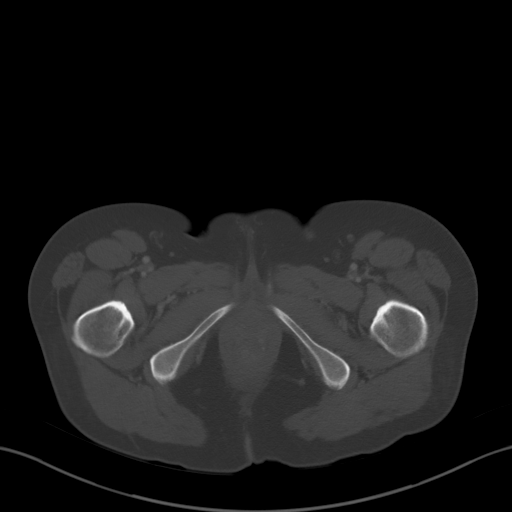
[im 11/95  soft-tissue]
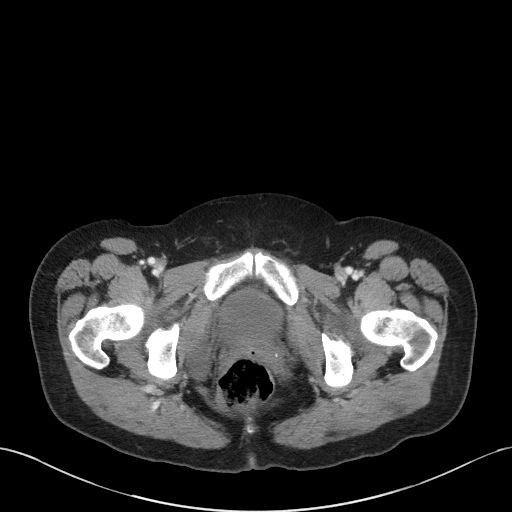
[im 21/95  soft-tissue]
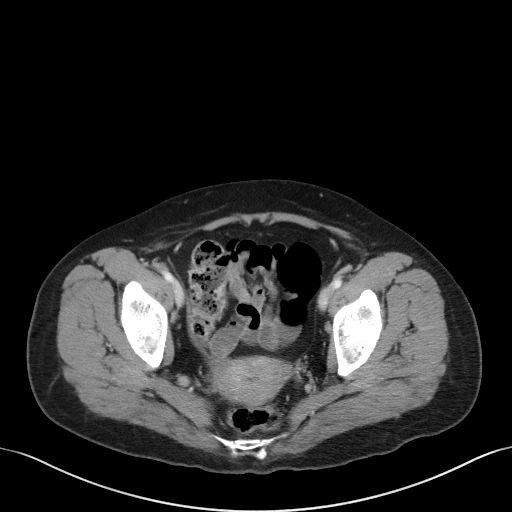
[im 27/95  soft-tissue]
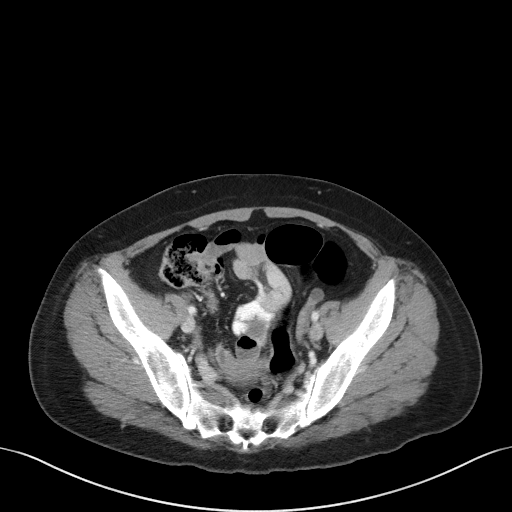
[im 32/95  soft-tissue]
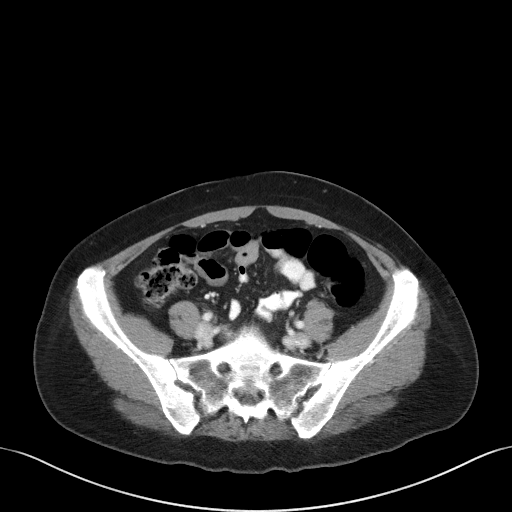
[im 42/95  soft-tissue]
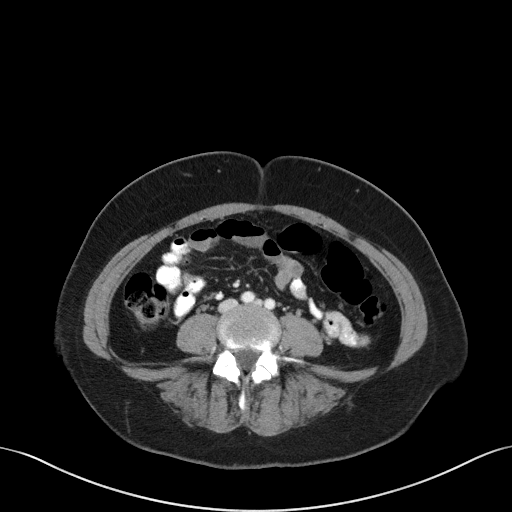
[im 48/95  soft-tissue]
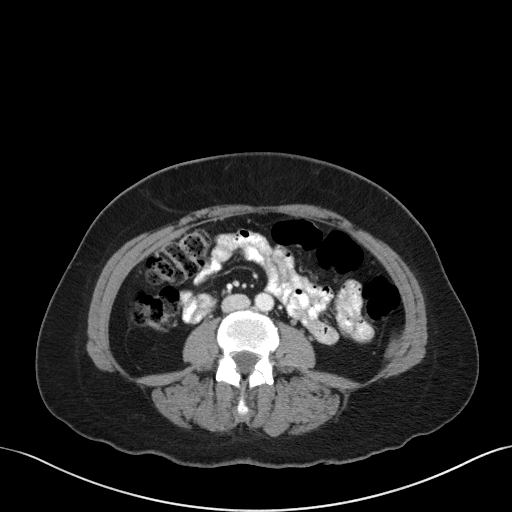
[im 53/95  soft-tissue]
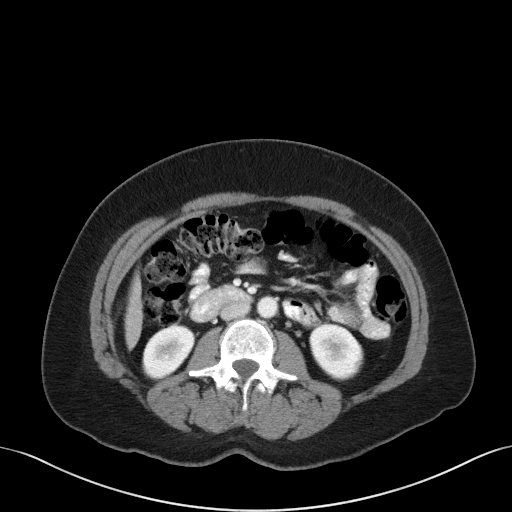
[im 63/95  soft-tissue]
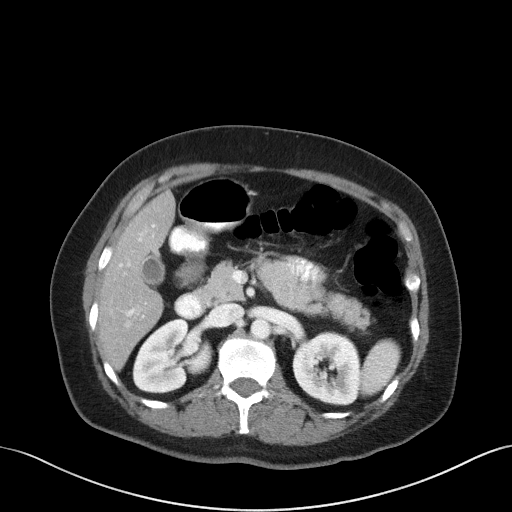
[im 63/95  bone]
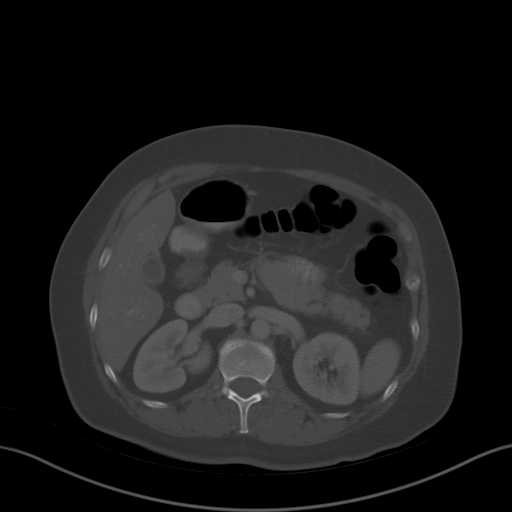
[im 68/95  soft-tissue]
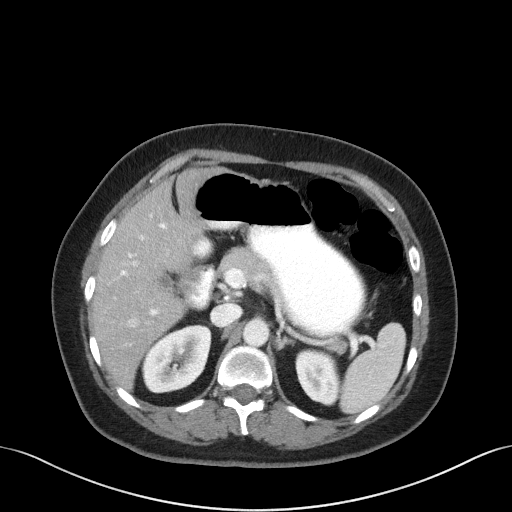
[im 74/95  soft-tissue]
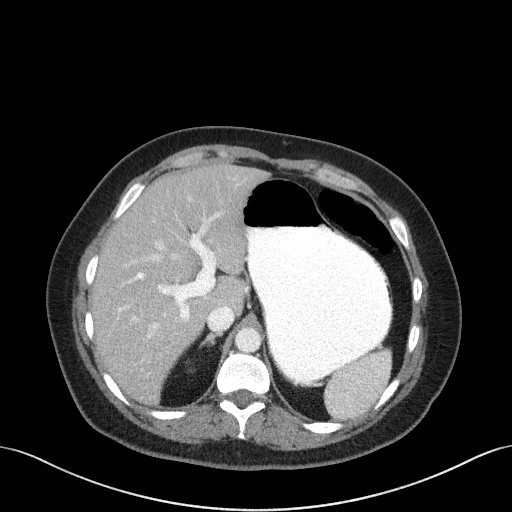
[im 84/95  soft-tissue]
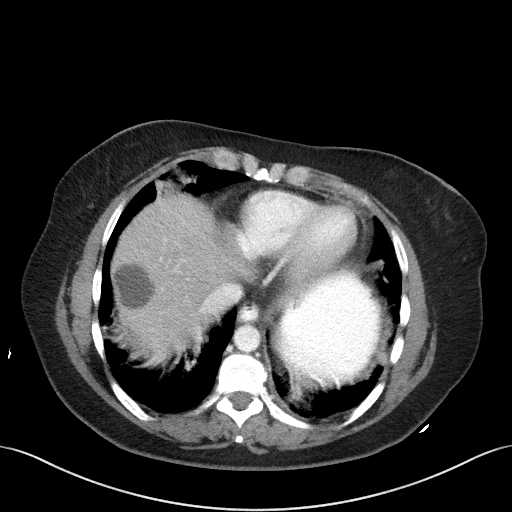
[im 89/95  soft-tissue]
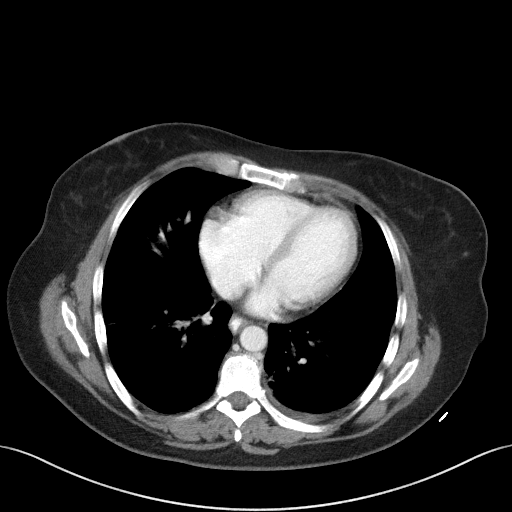

[Series 5: coronal st · coronal · 0.73mm/px · 3 of 85 slices shown]
[im 29/85  soft-tissue]
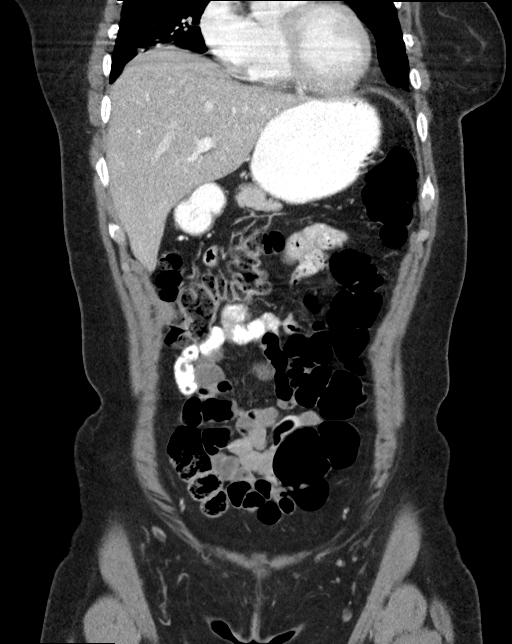
[im 38/85  soft-tissue]
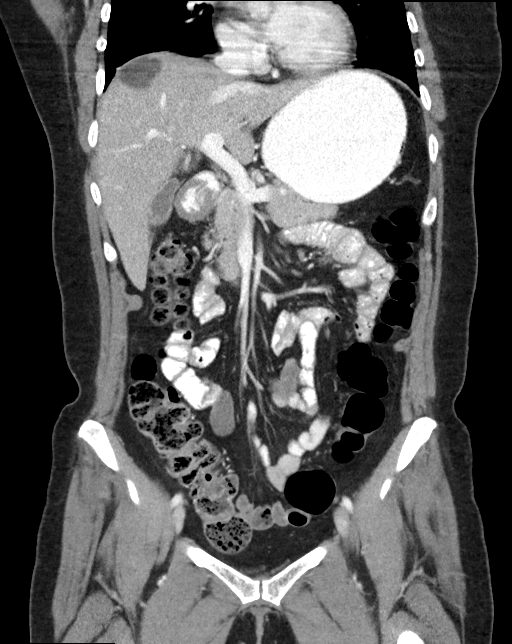
[im 47/85  soft-tissue]
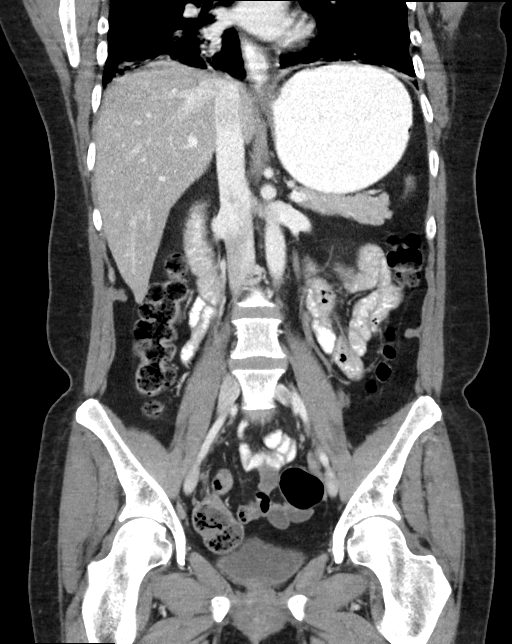

[16 of 46 positions shown; findings below may reference images not displayed]

FINDINGS: Lower chest: Minimal atelectasis of bilateral lung bases are noted.
Minimal left pleural effusion is noted.

Hepatobiliary: There is a 3.2 x 3.7 cm simple cyst in the dome of
liver. There is mild diffuse low density of the liver likely due to
fatty infiltration. The gallbladder and biliary tree are normal.

Pancreas: Unremarkable. No pancreatic ductal dilatation or
surrounding inflammatory changes.

Spleen: Normal in size without focal abnormality.

Adrenals/Urinary Tract: The bilateral adrenal glands are normal.
There are simple cyst in the left kidney. No focal right kidney
lesion is identified. There is no hydronephrosis bilaterally. The
bladder is normal.

Stomach/Bowel: Stomach is within normal limits. Appendix appears
normal. No evidence of bowel wall thickening, distention, or
inflammatory changes.

Vascular/Lymphatic: No significant vascular findings are present. No
enlarged abdominal or pelvic lymph nodes.

Reproductive: Uterus and bilateral adnexa are unremarkable.

Other: None.

Musculoskeletal: No acute abnormality.
IMPRESSION: No acute abnormality identified in the abdomen and pelvis.

No bowel obstruction.

Liver and left kidney cysts.

## 2020-07-14 IMAGING — US US ABDOMEN LIMITED
1 series · 13 of 25 positions shown · non-contrast
Comparison: December 07, 2015

CLINICAL DATA: Gallbladder polyps

EXAM:
ULTRASOUND ABDOMEN LIMITED RIGHT UPPER QUADRANT

[Series 2: us abdomen limited · 0.23mm/px · 13 of 53 slices shown]
[im 1/53]
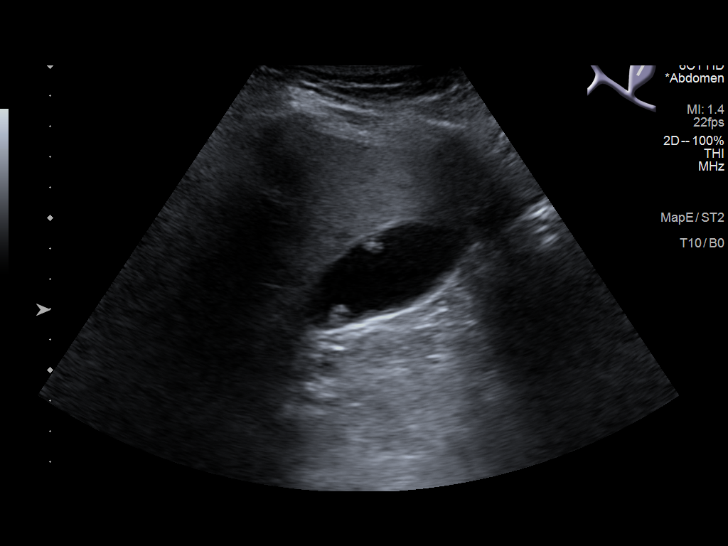
[im 5/53]
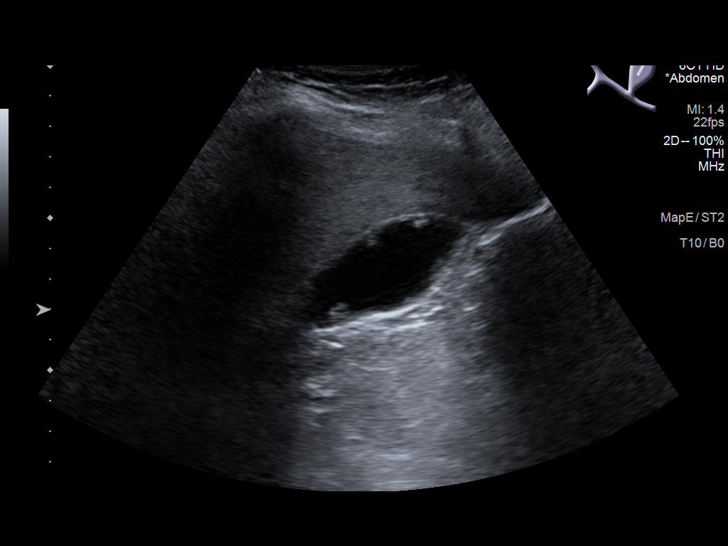
[im 9/53]
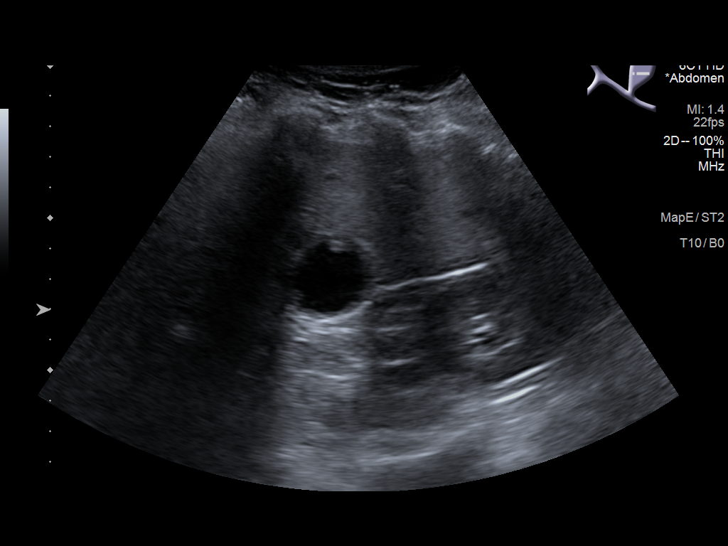
[im 14/53]
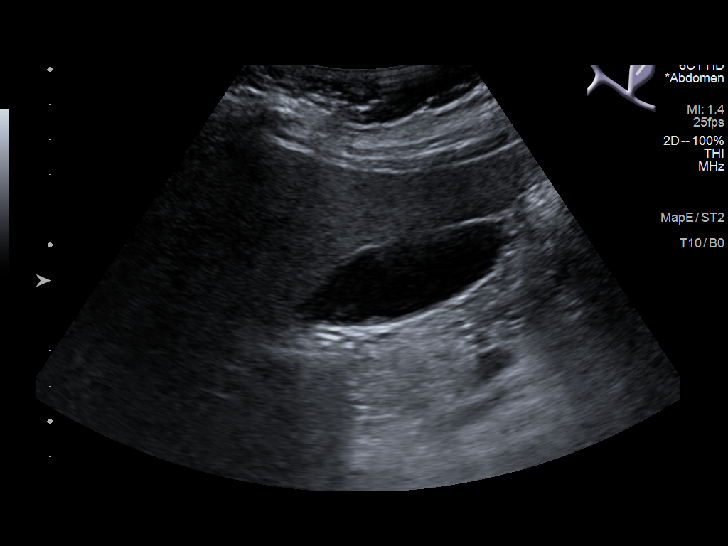
[im 18/53]
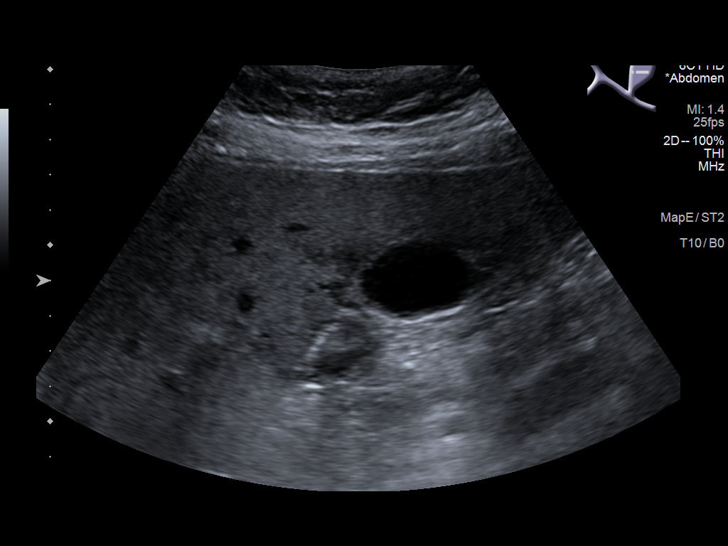
[im 22/53]
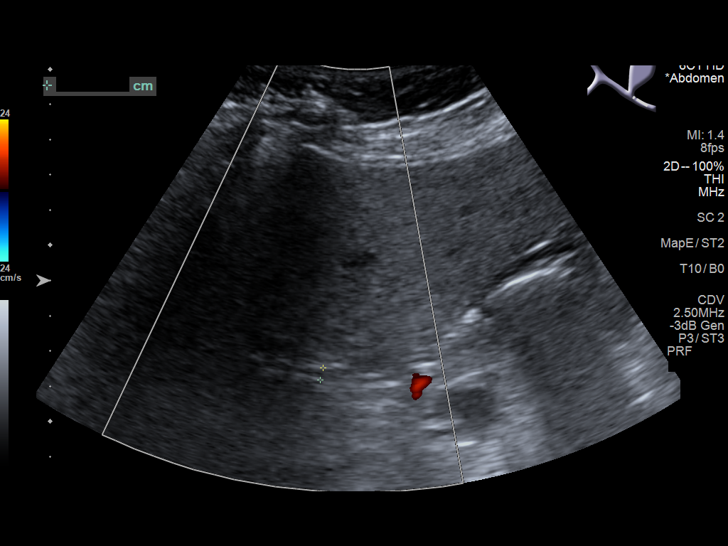
[im 27/53]
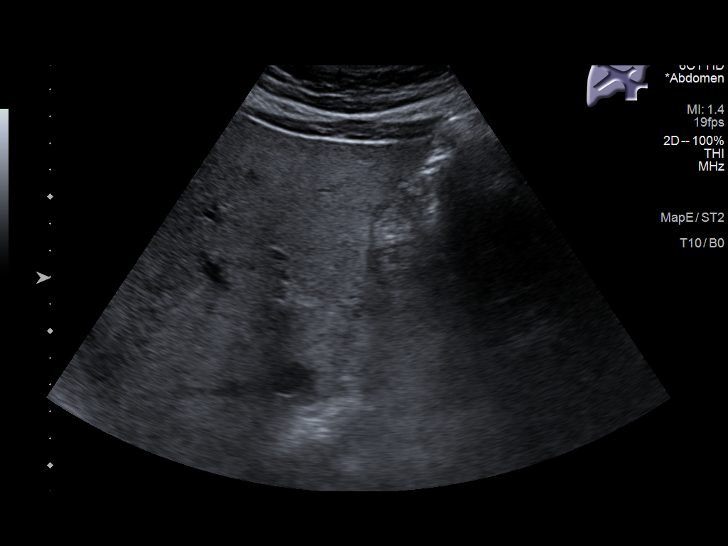
[im 31/53]
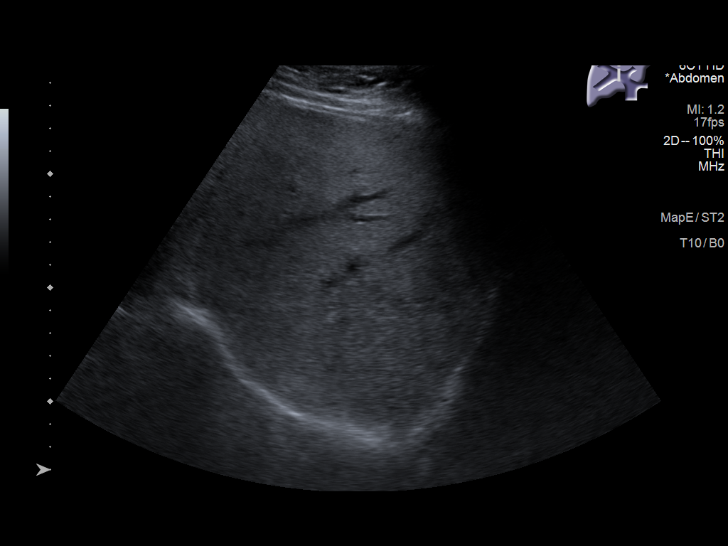
[im 35/53]
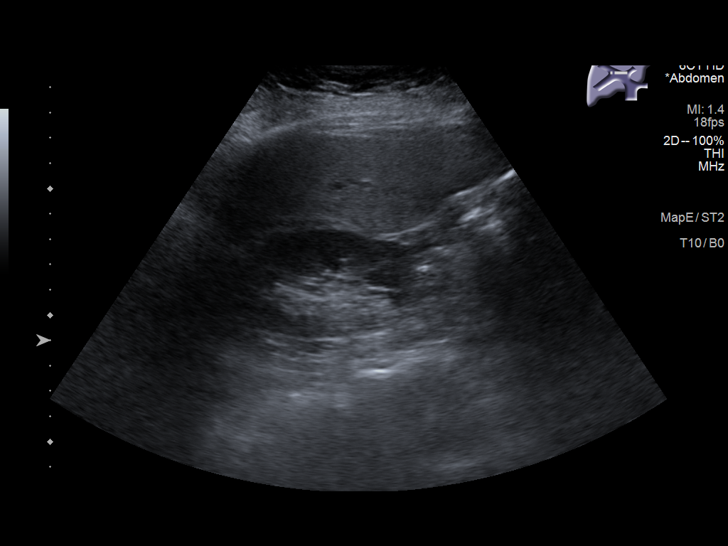
[im 40/53]
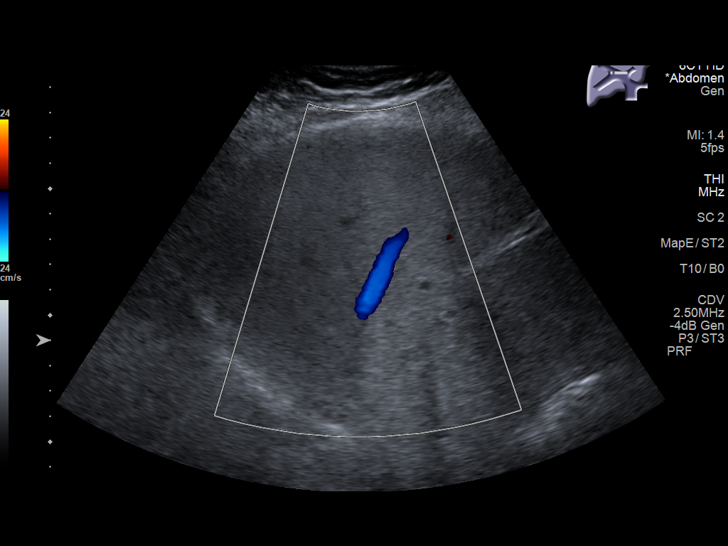
[im 44/53]
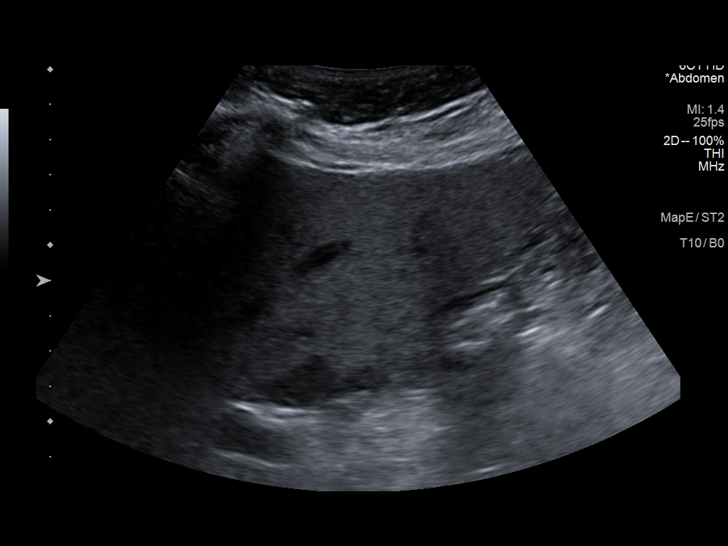
[im 48/53]
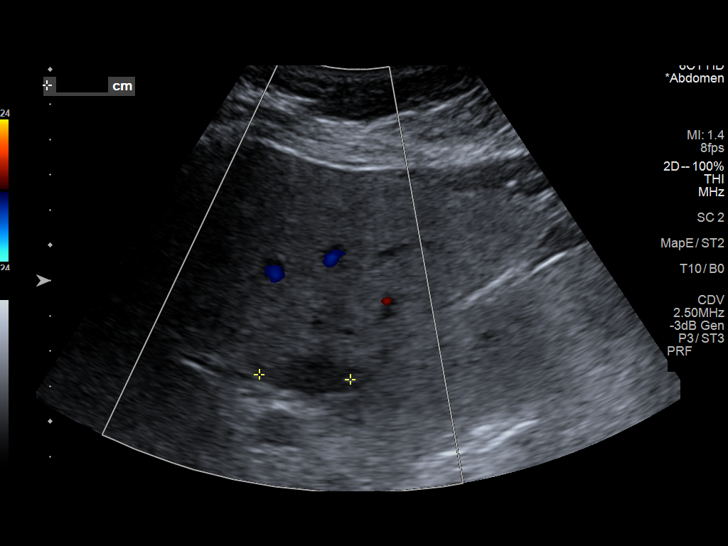
[im 53/53]
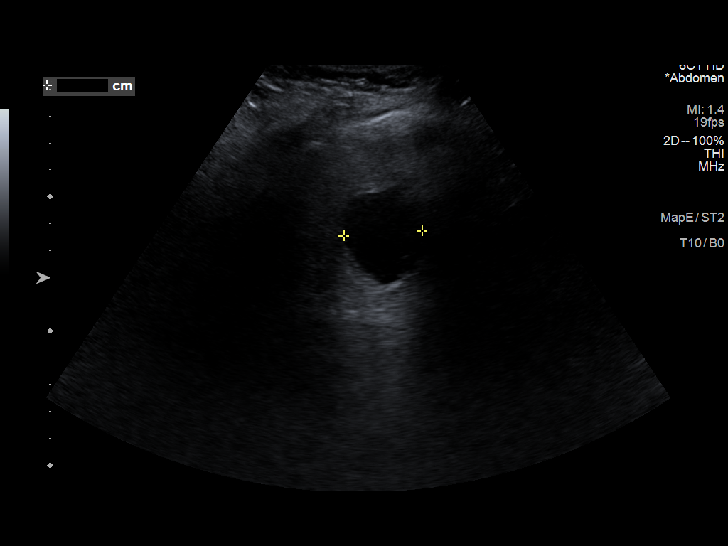

[13 of 25 positions shown; findings below may reference images not displayed]

FINDINGS: Gallbladder:

Gallbladder polyps are again noted with the largest polyp again
measuring 7 mm. No new polypoid lesions identified. No echogenic
foci in the gallbladder which move and shadow as is expected with
gallstones. Gallbladder wall thickness is upper normal. No
gallbladder wall edema or pericholecystic fluid. No sonographic
Murphy sign noted by sonographer.

Common bile duct:

Diameter: 4 mm. No intrahepatic or extrahepatic biliary duct
dilatation.

Liver:

There is a cyst in the anterior segment of the right lobe of the
liver measuring 3.4 x 2.8 x 2.9 cm. There is diffuse increased liver
echogenicity with apparent fatty sparing near the porta hepatis.
Portal vein is patent on color Doppler imaging with normal direction
of blood flow towards the liver.

Other: None.
IMPRESSION: 1. Stable appearing gallbladder polyps with the largest polyp
measuring just over 7 mm. Follow-up ultrasound of the gallbladder in
1 year advised per consensus guidelines. No gallstones, gallbladder
wall thickening, or pericholecystic fluid evident.

2. Diffuse increase in liver echogenicity consistent with hepatic
steatosis. Probable fatty sparing near the gallbladder fossa. Cyst
noted in anterior segment right lobe of liver. Note that the
sensitivity of ultrasound for detection of noncystic lesions in the
liver is diminished given this degree of hepatic steatosis.

## 2024-07-06 ENCOUNTER — Encounter: Payer: Self-pay | Admitting: Family Medicine

## 2024-09-08 ENCOUNTER — Other Ambulatory Visit: Payer: Self-pay

## 2024-09-08 DIAGNOSIS — Z1231 Encounter for screening mammogram for malignant neoplasm of breast: Secondary | ICD-10-CM

## 2024-09-25 NOTE — Progress Notes (Unsigned)
 Ms. Mckaela Howley is a 53 y.o. female who presents to [facility] clinic today with {Blank single:19197::no complaints,complaint of} ***.    Pap Smear: Pap smear not completed today. Last Pap smear was 07/27/2024 at [facility] clinic and was normal with negative HPV. Per patient has {Blank single:19197::no history,history} of an abnormal Pap smear. Last Pap smear result is available in Epic.   Physical exam: Breasts Breasts symmetrical. No skin abnormalities bilateral breasts. No nipple retraction bilateral breasts. No nipple discharge bilateral breasts. No lymphadenopathy. No lumps palpated bilateral breasts.       Pelvic/Bimanual Pap is not indicated today per BCCCP guidelines.   Smoking History: Patient has {Blank single:19197::never smoked,is a former smoker,is a current smoker at *** packs per day} ***referred to quit line.    Patient Navigation: Patient education provided. Access to services provided for patient through COM[facility] program. Spanish interpreter Damon Pierce from [facility] provided.   Colorectal Cancer Screening: Per patient {Blank single:19197::has had colonoscopy completed on ***,has never had colonoscopy completed} No complaints today.    Breast and Cervical Cancer Risk Assessment: Patient {Blank single:19197::has,does not have} family history of breast cancer, known genetic mutations, or radiation treatment to the chest before age 48. Patient {Blank single:19197::has,does not have} history of cervical dysplasia, immunocompromised, or DES exposure in-utero.  Risk Assessment   No risk assessment data     A: BCCCP exam without pap smear Complaint of ***  P: Referred patient to the [facility] for a screening mammogram. Appointment scheduled ***.  Driscilla Wanda SQUIBB, RN 09/25/2024 11:23 AM

## 2024-09-26 ENCOUNTER — Ambulatory Visit: Payer: Self-pay | Attending: Obstetrics and Gynecology

## 2024-09-26 ENCOUNTER — Ambulatory Visit
Admission: RE | Admit: 2024-09-26 | Discharge: 2024-09-26 | Disposition: A | Discharge status: Home / Self Care | Payer: Self-pay | Source: Ambulatory Visit | Attending: Obstetrics and Gynecology | Admitting: Obstetrics and Gynecology

## 2024-09-26 VITALS — BP 123/92 | Ht 63.0 in | Wt 154.0 lb

## 2024-09-26 DIAGNOSIS — Z1231 Encounter for screening mammogram for malignant neoplasm of breast: Secondary | ICD-10-CM | POA: Insufficient documentation

## 2024-09-26 DIAGNOSIS — Z1239 Encounter for other screening for malignant neoplasm of breast: Secondary | ICD-10-CM

## 2024-09-26 NOTE — Patient Instructions (Addendum)
 Explained breast self awareness with Maria Schmitt. Patient did not need a Pap smear today due to last Pap smear and HPV typing was 07/27/2024. Let her know BCCCP will cover Pap smears and HPV typing every 5 years unless has a history of abnormal Pap smears. Referred patient to the [facility] for a screening mammogram. Appointment scheduled Tuesday, September 26, 2024 at 1540. Patient aware of appointment and will be there. Let patient know [facility] will follow up with her within the next couple weeks with results of her mammogram by letter or phone. Maria Schmitt verbalized understanding.  Neven Fina, Wanda Ship, RN 3:31 PM

## 2024-10-02 ENCOUNTER — Encounter: Payer: Self-pay | Admitting: Family Medicine
